# Patient Record
Sex: Female | Born: 2000 | Race: Black or African American | Hispanic: No | Marital: Single | Smoking: Never smoker
Health system: Southern US, Community
[De-identification: ages and names within clinical notes are randomized; demographics above are authoritative.]

## PROBLEM LIST (undated history)

## (undated) DIAGNOSIS — J45909 Unspecified asthma, uncomplicated: Secondary | ICD-10-CM

## (undated) DIAGNOSIS — I10 Essential (primary) hypertension: Secondary | ICD-10-CM

## (undated) DIAGNOSIS — K5900 Constipation, unspecified: Secondary | ICD-10-CM

## (undated) HISTORY — PX: NO PAST SURGERIES: SHX2092

---
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
## 2022-01-10 ENCOUNTER — Ambulatory Visit (INDEPENDENT_AMBULATORY_CARE_PROVIDER_SITE_OTHER): Payer: Medicaid Other | Admitting: Nurse Practitioner

## 2022-01-10 ENCOUNTER — Encounter (HOSPITAL_BASED_OUTPATIENT_CLINIC_OR_DEPARTMENT_OTHER): Payer: Self-pay | Admitting: Nurse Practitioner

## 2022-01-10 VITALS — BP 117/72 | HR 68 | Ht 62.0 in | Wt 311.7 lb

## 2022-01-10 DIAGNOSIS — Z6841 Body Mass Index (BMI) 40.0 and over, adult: Secondary | ICD-10-CM

## 2022-01-10 DIAGNOSIS — F411 Generalized anxiety disorder: Secondary | ICD-10-CM | POA: Diagnosis not present

## 2022-01-10 DIAGNOSIS — F332 Major depressive disorder, recurrent severe without psychotic features: Secondary | ICD-10-CM

## 2022-01-10 MED ORDER — SERTRALINE HCL 50 MG PO TABS: 50.0000 mg | ORAL_TABLET | Freq: Every day | ORAL | 3 refills | Status: DC
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
## 2022-01-10 NOTE — Patient Instructions (Signed)
Thank you for choosing [facility] at [facility] for your Primary Care needs. I am excited for the opportunity to partner with you to meet your health care goals. It was a pleasure meeting you today!  Recommendations from today's visit: I will get some labs today - I will be in touch with you about the results We can make some plans about your weight management once we have those results back I want you to restart the sertraline at 68m. I would like to check with you in about 2 weeks with a phone call to see how you are feeling and we can plan to increase if needed I want you to try to keep your carbohydrates between 150-180 grams a day I want you to eat protein with every meal Start walking 10 minutes a day  Information on diet, exercise, and health maintenance recommendations are listed below. This is information to help you be sure you are on track for optimal health and monitoring.   Please look over this and let [place] if you have any questions or if you have completed any of the health maintenance outside of [facility] that we can be sure your records are up to date.  ___________________________________________________________ About Me: I am an Adult-Geriatric Nurse Practitioner with a background in caring for patients for more than 20 years with a strong intensive care background. I provide primary care and sports medicine services to patients age 204and older within this office. My education had a strong focus on caring for the older adult population, which I am passionate about. I am also the director of the APP Fellowship with [facility]   My desire is to provide you with the best service through preventive medicine and supportive care. I consider you a part of the medical team and value your input. I work diligently to ensure that you are heard and your needs are met in a safe and effective manner. I want you to feel comfortable with me as your provider and  want you to know that your health concerns are important to me.  For your information, our office hours are: Monday, Tuesday, and Thursday 8:00 AM - 5:00 PM Wednesday and Friday 8:00 AM - 12:00 PM.   In my time away from the office I am teaching new APP's within the system and am unavailable, but my partner, Dr. dBurnard Buntingis in the office for emergent needs.   If you have questions or concerns, please call our office at [phone] send [facility] MyChart message and we will respond as quickly as possible.  ____________________________________________________________ MyChart:  For all urgent or time sensitive needs we ask that you please call the office to avoid delays. Our number is [phone]. MyChart is not constantly monitored and due to the large volume of messages a day, replies may take up to 72 business hours.  MyChart Policy: MyChart allows for you to see your visit notes, after visit summary, provider recommendations, lab and tests results, make an appointment, request refills, and contact your provider or the office for non-urgent questions or concerns. Providers are seeing patients during normal business hours and do not have built in time to review MyChart messages.  We ask that you allow a minimum of 3 business days for responses to [employer] For this reason, please do not send urgent requests through [place] Please call the office at [phone] New and ongoing conditions may require a visit. We have virtual  and in person visit available for your convenience.  Complex MyChart concerns may require a visit. Your provider may request you schedule a virtual or in person visit to ensure we are providing the best care possible. MyChart messages sent after 11:00 AM on Friday will not be received by the provider until Monday morning.    Lab and Test Results: You will receive your lab and test results on MyChart as soon as they are completed and results have been sent by the lab  or testing facility. Due to this service, you will receive your results BEFORE your provider.  I review lab and tests results each morning prior to seeing patients. Some results require collaboration with other providers to ensure you are receiving the most appropriate care. For this reason, we ask that you please allow a minimum of 3-5 business days from the time the ALL results have been received for your provider to receive and review lab and test results and contact you about these.  Most lab and test result comments from the provider will be sent through [facility]. Your provider may recommend changes to the plan of care, follow-up visits, repeat testing, ask questions, or request an office visit to discuss these results. You may reply directly to this message or call the office at [phone] to provide information for the provider or set up an appointment. In some instances, you will be called with test results and recommendations. Please let us know if this is preferred and we will make note of this in your chart to provide this for you.    If you have not heard a response to your lab or test results in 5 business days from all results returning to [place], please call the office to let us know. We ask that you please avoid calling prior to this time unless there is an emergent concern. Due to high call volumes, this can delay the resulting process.  After Hours: For all non-emergency after hours needs, please call the office at [phone] and select the option to reach the on-call provider service. On-call services are shared between multiple [facility] offices and therefore it will not be possible to speak directly with your provider. On-call providers may provide medical advice and recommendations, but are unable to provide refills for maintenance medications.  For all emergency or urgent medical needs after normal business hours, we recommend that you seek care at the closest Urgent Care or  Emergency Department to ensure appropriate treatment in a timely manner.  [facility] has a 24 hour emergency room located on the ground floor for your convenience.   Urgent Concerns During the Business Day Providers are seeing patients from 8AM to [place] with a busy schedule and are most often not able to respond to non-urgent calls until the end of the day or the next business day. If you should have URGENT concerns during the day, please call and speak to the nurse or schedule a same day appointment so that we can address your concern without delay.   Thank you, again, for choosing me as your health care partner. I appreciate your trust and look forward to learning more about you.   Worthy Keeler, DNP, AGNP-c ___________________________________________________________  Health Maintenance Recommendations Screening Testing Mammogram Every 1 -2 years based on history and risk factors Starting at age 9 Pap Smear Ages 21-39 every 3 years Ages 72-65 every 5 years with HPV testing More frequent testing may be required based on results  and history Colon Cancer Screening Every 1-10 years based on test performed, risk factors, and history Starting at age 44 Bone Density Screening Every 2-10 years based on history Starting at age 78 for women Recommendations for men differ based on medication usage, history, and risk factors AAA Screening One time ultrasound Men 72-71 years old who have every smoked Lung Cancer Screening Low Dose Lung CT every 12 months Age 23-80 years with a 30 pack-year smoking history who still smoke or who have quit within the last 15 years  Screening Labs Routine  Labs: Complete Blood Count (CBC), Complete Metabolic Panel (CMP), Cholesterol (Lipid Panel) Every 6-12 months based on history and medications May be recommended more frequently based on current conditions or previous results Hemoglobin A1c Lab Every 3-12 months based on history  and previous results Starting at age 44 or earlier with diagnosis of diabetes, high cholesterol, BMI >26, and/or risk factors Frequent monitoring for patients with diabetes to ensure blood sugar control Thyroid Panel (TSH w/ T3 & T4) Every 6 months based on history, symptoms, and risk factors May be repeated more often if on medication HIV One time testing for all patients 61 and older May be repeated more frequently for patients with increased risk factors or exposure Hepatitis C One time testing for all patients 66 and older May be repeated more frequently for patients with increased risk factors or exposure Gonorrhea, Chlamydia Every 12 months for all sexually active persons 13-24 years Additional monitoring may be recommended for those who are considered high risk or who have symptoms PSA Men 88-3 years old with risk factors Additional screening may be recommended from age 37-69 based on risk factors, symptoms, and history  Vaccine Recommendations Tetanus Booster All adults every 10 years Flu Vaccine All patients 6 months and older every year COVID Vaccine All patients 12 years and older Initial dosing with booster May recommend additional booster based on age and health history HPV Vaccine 2 doses all patients age 24-26 Dosing may be considered for patients over 26 Shingles Vaccine (Shingrix) 2 doses all adults 80 years and older Pneumonia (Pneumovax 23) All adults 45 years and older May recommend earlier dosing based on health history Pneumonia (Prevnar 95) All adults 75 years and older Dosed 1 year after Pneumovax 23  Additional Screening, Testing, and Vaccinations may be recommended on an individualized basis based on family history, health history, risk factors, and/or exposure.  __________________________________________________________  Diet Recommendations for All Patients  I recommend that all patients maintain a diet low in saturated fats, carbohydrates, and  cholesterol. While this can be challenging at first, it is not impossible and small changes can make big differences.  Things to try: Decreasing the amount of soda, sweet tea, and/or juice to one or less per day and replace with water While water is always the first choice, if you do not like water you may consider adding a water additive without sugar to improve the taste other sugar free drinks Replace potatoes with a brightly colored vegetable at dinner Use healthy oils, such as canola oil or olive oil, instead of butter or hard margarine Limit your bread intake to two pieces or less a day Replace regular pasta with low carb pasta options Bake, broil, or grill foods instead of frying Monitor portion sizes  Eat smaller, more frequent meals throughout the day instead of large meals  An important thing to remember is, if you love foods that are not great for your health, you don't  have to give them up completely. Instead, allow these foods to be a reward when you have done well. Allowing yourself to still have special treats every once in a while is a nice way to tell yourself thank you for working hard to keep yourself healthy.   Also remember that every day is a new day. If you have a bad day and "fall off the wagon", you can still climb right back up and keep moving along on your journey!  We have resources available to help you!  Some websites that may be helpful include: www.http://carter.biz/  Www.VeryWellFit.com _____________________________________________________________  Activity Recommendations for All Patients  I recommend that all adults get at least 20 minutes of moderate physical activity that elevates your heart rate at least 5 days out of the week.  Some examples include: Walking or jogging at a pace that allows you to carry on a conversation Cycling (stationary bike or outdoors) Water aerobics Yoga Weight lifting Dancing If physical limitations prevent you from putting  stress on your joints, exercise in a pool or seated in a chair are excellent options.  Do determine your MAXIMUM heart rate for activity: YOUR AGE - 220 = MAX HeartRate   Remember! Do not push yourself too hard.  Start slowly and build up your pace, speed, weight, time in exercise, etc.  Allow your body to rest between exercise and get good sleep. You will need more water than normal when you are exerting yourself. Do not wait until you are thirsty to drink. Drink with a purpose of getting in at least 8, 8 ounce glasses of water a day plus more depending on how much you exercise and sweat.    If you begin to develop dizziness, chest pain, abdominal pain, jaw pain, shortness of breath, headache, vision changes, lightheadedness, or other concerning symptoms, stop the activity and allow your body to rest. If your symptoms are severe, seek emergency evaluation immediately. If your symptoms are concerning, but not severe, please let us know so that we can recommend further evaluation.

## 2022-01-11 DIAGNOSIS — Z6841 Body Mass Index (BMI) 40.0 and over, adult: Secondary | ICD-10-CM | POA: Insufficient documentation

## 2022-01-11 LAB — CBC WITH DIFF/PLATELET
Basophils Absolute: 0 10*3/uL (ref 0.0–0.2)
Basos: 1 %
EOS (ABSOLUTE): 0.3 10*3/uL (ref 0.0–0.4)
Eos: 6 %
Hematocrit: 38.1 % (ref 34.0–46.6)
Hemoglobin: 12.9 g/dL (ref 11.1–15.9)
Immature Grans (Abs): 0 10*3/uL (ref 0.0–0.1)
Immature Granulocytes: 1 %
Lymphocytes Absolute: 1.7 10*3/uL (ref 0.7–3.1)
Lymphs: 43 %
MCH: 29.3 pg (ref 26.6–33.0)
MCHC: 33.9 g/dL (ref 31.5–35.7)
MCV: 86 fL (ref 79–97)
Monocytes Absolute: 0.5 10*3/uL (ref 0.1–0.9)
Monocytes: 12 %
Neutrophils Absolute: 1.5 10*3/uL (ref 1.4–7.0)
Neutrophils: 37 %
Platelets: 349 10*3/uL (ref 150–450)
RBC: 4.41 x10E6/uL (ref 3.77–5.28)
RDW: 12.3 % (ref 11.7–15.4)
WBC: 4 10*3/uL (ref 3.4–10.8)

## 2022-01-11 LAB — COMPREHENSIVE METABOLIC PANEL
ALT: 13 IU/L (ref 0–32)
AST: 16 IU/L (ref 0–40)
Albumin/Globulin Ratio: 1.4 (ref 1.2–2.2)
Albumin: 4.3 g/dL (ref 3.9–5.0)
Alkaline Phosphatase: 56 IU/L (ref 42–106)
BUN/Creatinine Ratio: 12 (ref 9–23)
BUN: 8 mg/dL (ref 6–20)
Bilirubin Total: 0.3 mg/dL (ref 0.0–1.2)
CO2: 24 mmol/L (ref 20–29)
Calcium: 9.6 mg/dL (ref 8.7–10.2)
Chloride: 103 mmol/L (ref 96–106)
Creatinine, Ser: 0.68 mg/dL (ref 0.57–1.00)
Globulin, Total: 3.1 g/dL (ref 1.5–4.5)
Glucose: 87 mg/dL (ref 70–99)
Potassium: 4 mmol/L (ref 3.5–5.2)
Sodium: 138 mmol/L (ref 134–144)
Total Protein: 7.4 g/dL (ref 6.0–8.5)
eGFR: 129 mL/min/{1.73_m2} (ref >59)

## 2022-01-11 LAB — THYROID PANEL WITH TSH
Free Thyroxine Index: 1.9 (ref 1.2–4.9)
T3 Uptake Ratio: 26 % (ref 24–39)
T4, Total: 7.2 ug/dL (ref 4.5–12.0)
TSH: 1.1 u[IU]/mL (ref 0.450–4.500)

## 2022-01-11 LAB — HEMOGLOBIN A1C
Est. average glucose Bld gHb Est-mCnc: 114 mg/dL
Hgb A1c MFr Bld: 5.6 % (ref 4.8–5.6)

## 2022-01-11 LAB — LIPID PANEL
Chol/HDL Ratio: 4.6 ratio — ABNORMAL HIGH (ref 0.0–4.4)
Cholesterol, Total: 187 mg/dL — ABNORMAL HIGH (ref 100–169)
HDL: 41 mg/dL (ref >39)
LDL Chol Calc (NIH): 135 mg/dL — ABNORMAL HIGH (ref 0–109)
Triglycerides: 60 mg/dL (ref 0–89)
VLDL Cholesterol Cal: 11 mg/dL (ref 5–40)

## 2022-01-11 LAB — VITAMIN D 25 HYDROXY (VIT D DEFICIENCY, FRACTURES): Vit D, 25-Hydroxy: 5.9 ng/mL — ABNORMAL LOW (ref 30.0–100.0)

## 2022-01-11 NOTE — Assessment & Plan Note (Signed)
BMI 57.01 today.  Discussed multiple options with patient that can help with weight loss assistance.  Would like to obtain baseline labs today for evaluation to ensure that there are no underlying conditions that could be contributing to her symptoms.  She is in agreement to this today. Discussion with patient on healthy dietary options to best manage weight as well as utilization of carbohydrate counting and appropriate snacking.  Handout information provided to patient. We will monitor labs and make changes to plan of care as necessary based on findings.  I do feel that this patient be an excellent candidate for GLP-1 medication given her current BMI and age.  I would like to prevent chronic illnesses by managing this nausea symptom rather than trying to fix illnesses when she is older.  We will plan to follow-up in the next couple of weeks once labs have resulted.

## 2022-01-11 NOTE — Assessment & Plan Note (Signed)
Chronic.  Previously well managed with sertraline 50 mg once a day.  We will begin this medication today and monitor closely.  No alarm symptoms present at this time.

## 2022-01-11 NOTE — Assessment & Plan Note (Signed)
Chronic.  Has been struggling with this for many years.  She has previously been on sertraline with good control.  She would like to restart this medication today if possible.  I do feel that this is appropriate start.  No SI/HI today.  Patient is oriented and making appropriate decisions.  Patient praised for her recognition that the medication is helpful and encouraged her to utilize medications as she would with any other chronic illness.  We will continue to follow closely.

## 2022-01-17 ENCOUNTER — Other Ambulatory Visit (HOSPITAL_BASED_OUTPATIENT_CLINIC_OR_DEPARTMENT_OTHER): Payer: Self-pay

## 2022-01-17 DIAGNOSIS — E559 Vitamin D deficiency, unspecified: Secondary | ICD-10-CM

## 2022-01-17 MED ORDER — VITAMIN D (ERGOCALCIFEROL) 1.25 MG (50000 UNIT) PO CAPS: 50000.0000 [IU] | ORAL_CAPSULE | ORAL | 0 refills | Status: DC

## 2022-01-24 ENCOUNTER — Encounter (HOSPITAL_BASED_OUTPATIENT_CLINIC_OR_DEPARTMENT_OTHER): Payer: Self-pay | Admitting: Nurse Practitioner

## 2022-01-24 ENCOUNTER — Ambulatory Visit (INDEPENDENT_AMBULATORY_CARE_PROVIDER_SITE_OTHER): Payer: Medicaid Other | Admitting: Nurse Practitioner

## 2022-01-24 DIAGNOSIS — Z6841 Body Mass Index (BMI) 40.0 and over, adult: Secondary | ICD-10-CM | POA: Diagnosis not present

## 2022-01-24 DIAGNOSIS — F411 Generalized anxiety disorder: Secondary | ICD-10-CM | POA: Diagnosis not present

## 2022-01-24 DIAGNOSIS — F332 Major depressive disorder, recurrent severe without psychotic features: Secondary | ICD-10-CM

## 2022-01-24 MED ORDER — SERTRALINE HCL 50 MG PO TABS: 50.0000 mg | ORAL_TABLET | Freq: Every day | ORAL | 3 refills | Status: DC

## 2022-01-24 NOTE — Progress Notes (Signed)
Virtual Visit Encounter telephone visit.   I connected with  [name] on 02/07/22 at  9:30 AM EDT by secure audio telemedicine application. I verified that I am speaking with the correct person using two identifiers.   I introduced myself as a Publishing rights manager with the practice. The limitations of evaluation and management by telemedicine discussed with the patient and the availability of in person appointments. The patient expressed verbal understanding and consent to proceed.  Participating parties in this visit include: Myself and patient  The patient is: Patient Location: Home I am: Provider Location: Office/Clinic Subjective:    CC and HPI: Amarilys Lyles is a 21 y.o. year old female presenting for follow up of Mood and Weight. Patient reports the following: Anxiety and Depression -recently restarted sertraline 50mg , which was helpful for her mood symptoms in the past - She feels a lot better and tells me that she feels like her dosing is at a good place at this time - She is not having any side effects of the medication -She is taking her medication as prescribed without missed doses -She denies SI/HI  Weight management - She has been walking daily - She has been eating dinner earlier in the evening and working on her portion sizes - She is having some hunger in between meals, but is working hard to avoid unhealthy snacking.  Past medical history, Surgical history, Family history not pertinant except as noted below, Social history, Allergies, and medications have been entered into the medical record, reviewed, and corrections made.   Review of Systems:  All review of systems negative except what is listed in the HPI  Objective:    Alert and oriented x 4 Speaking in clear sentences with no shortness of breath. No distress.  Impression and Recommendations:    Problem List Items Addressed This Visit     Generalized anxiety disorder    Chronic.  She has recently  restarted sertraline and is doing very well on 50 mg.  I am pleased to hear that she is not having any side effects and is tolerating the medication well.  At this time recommend continuation of medication and continue monitoring for any new or worsening symptoms.  Patient encouraged to contact the office immediately if she begins to notice any changes in her mood in a negative way.  We will send refills for the sertraline today.      Relevant Medications   sertraline (ZOLOFT) 50 MG tablet   Severe episode of recurrent major depressive disorder, without psychotic features (HCC) - Primary    Chronic.  Significant improvement with start of sertraline 50 mg daily.  Patient is tolerating medication well and taking medication as prescribed.  No alarm symptoms are present at this time.  Given positive response we will send refills for medication today.  Patient encouraged to continue to monitor her mood and sleep patterns and notify immediately if new or worsening symptoms present.      Relevant Medications   sertraline (ZOLOFT) 50 MG tablet   BMI 50.0-59.9, adult (HCC)    Elevated BMI with increased risk of significant impact on cardiovascular health.  Unfortunately the patient's insurance would not cover GLP-1 weight loss medication.  I do feel that this would be greatly beneficial for the patient and her overall health status.  She is making great efforts to improve her overall health by monitoring her diet closely and exercising regularly.  I am so proud of her for recognizing what needs to  be done and making the changes needed for her health.  At this time recommend continuation with current measures.  I do feel that she is motivated to have successful weight loss on her own.  We will continue to monitor and review to see if we are able to get medications that may be helpful for her to reach her goals.      Relevant Medications   sertraline (ZOLOFT) 50 MG tablet    current treatment plan is  effective, no change in therapy I discussed the assessment and treatment plan with the patient. The patient was provided an opportunity to ask questions and all were answered. The patient agreed with the plan and demonstrated an understanding of the instructions.   The patient was advised to call back or seek an in-person evaluation if the symptoms worsen or if the condition fails to improve as anticipated.  Follow-Up: in 3 months  I provided 33 minutes of non-face-to-face interaction with this non face-to-face encounter including intake, same-day documentation, and chart review.   Tollie Eth, NP , DNP, AGNP-c [facility] Primary Care & Sports Medicine at [facility] [phone] (fax)

## 2022-01-24 NOTE — Patient Instructions (Addendum)
Keep up the great work!! I am so proud of you.

## 2022-02-07 NOTE — Assessment & Plan Note (Signed)
Chronic.  Significant improvement with start of sertraline 50 mg daily.  Patient is tolerating medication well and taking medication as prescribed.  No alarm symptoms are present at this time.  Given positive response we will send refills for medication today.  Patient encouraged to continue to monitor her mood and sleep patterns and notify immediately if new or worsening symptoms present.

## 2022-02-07 NOTE — Assessment & Plan Note (Signed)
Elevated BMI with increased risk of significant impact on cardiovascular health.  Unfortunately the patient's insurance would not cover GLP-1 weight loss medication.  I do feel that this would be greatly beneficial for the patient and her overall health status.  She is making great efforts to improve her overall health by monitoring her diet closely and exercising regularly.  I am so proud of her for recognizing what needs to be done and making the changes needed for her health.  At this time recommend continuation with current measures.  I do feel that she is motivated to have successful weight loss on her own.  We will continue to monitor and review to see if we are able to get medications that may be helpful for her to reach her goals.

## 2022-02-07 NOTE — Assessment & Plan Note (Signed)
Chronic.  She has recently restarted sertraline and is doing very well on 50 mg.  I am pleased to hear that she is not having any side effects and is tolerating the medication well.  At this time recommend continuation of medication and continue monitoring for any new or worsening symptoms.  Patient encouraged to contact the office immediately if she begins to notice any changes in her mood in a negative way.  We will send refills for the sertraline today.

## 2022-04-10 ENCOUNTER — Encounter (HOSPITAL_BASED_OUTPATIENT_CLINIC_OR_DEPARTMENT_OTHER): Payer: Self-pay | Admitting: Nurse Practitioner

## 2022-04-26 ENCOUNTER — Ambulatory Visit (HOSPITAL_BASED_OUTPATIENT_CLINIC_OR_DEPARTMENT_OTHER): Payer: Medicaid Other | Admitting: Nurse Practitioner

## 2022-04-26 ENCOUNTER — Other Ambulatory Visit (HOSPITAL_BASED_OUTPATIENT_CLINIC_OR_DEPARTMENT_OTHER): Payer: Self-pay

## 2022-04-26 ENCOUNTER — Encounter (HOSPITAL_BASED_OUTPATIENT_CLINIC_OR_DEPARTMENT_OTHER): Payer: Self-pay | Admitting: Nurse Practitioner

## 2022-04-26 ENCOUNTER — Ambulatory Visit (INDEPENDENT_AMBULATORY_CARE_PROVIDER_SITE_OTHER): Payer: Medicaid Other | Admitting: Nurse Practitioner

## 2022-04-26 VITALS — BP 138/79 | HR 76 | Resp 99 | Ht 62.0 in | Wt 303.0 lb

## 2022-04-26 DIAGNOSIS — Z23 Encounter for immunization: Secondary | ICD-10-CM | POA: Diagnosis not present

## 2022-04-26 DIAGNOSIS — F411 Generalized anxiety disorder: Secondary | ICD-10-CM

## 2022-04-26 DIAGNOSIS — L308 Other specified dermatitis: Secondary | ICD-10-CM

## 2022-04-26 DIAGNOSIS — J4540 Moderate persistent asthma, uncomplicated: Secondary | ICD-10-CM

## 2022-04-26 DIAGNOSIS — F332 Major depressive disorder, recurrent severe without psychotic features: Secondary | ICD-10-CM | POA: Diagnosis not present

## 2022-04-26 DIAGNOSIS — Z6841 Body Mass Index (BMI) 40.0 and over, adult: Secondary | ICD-10-CM | POA: Diagnosis not present

## 2022-04-26 MED ORDER — BUDESONIDE-FORMOTEROL FUMARATE 160-4.5 MCG/ACT IN AERO
2.0000 | INHALATION_SPRAY | Freq: Two times a day (BID) | RESPIRATORY_TRACT | 1 refills | Status: DC
  Filled 2022-04-26 – 2022-06-26 (×4): qty 30.6, 90d supply, fill #0

## 2022-04-26 MED ORDER — IPRATROPIUM BROMIDE 0.06 % NA SOLN
2.0000 | Freq: Three times a day (TID) | NASAL | 11 refills | Status: DC
  Filled 2022-04-26 – 2022-05-21 (×2): qty 15, 25d supply, fill #0

## 2022-04-26 MED ORDER — ALBUTEROL SULFATE HFA 108 (90 BASE) MCG/ACT IN AERS
2.0000 | INHALATION_SPRAY | Freq: Four times a day (QID) | RESPIRATORY_TRACT | 5 refills | Status: DC | PRN
Start: 2022-04-26 — End: 2022-07-30
  Filled 2022-04-26: qty 18, 28d supply, fill #0
  Filled 2022-05-21: qty 18, 25d supply, fill #0

## 2022-04-26 MED ORDER — MONTELUKAST SODIUM 10 MG PO TABS
10.0000 mg | ORAL_TABLET | Freq: Every day | ORAL | 3 refills | Status: DC
  Filled 2022-04-26 – 2022-06-26 (×4): qty 90, 90d supply, fill #0

## 2022-04-26 MED ORDER — MOMETASONE FUROATE 0.1 % EX CREA
TOPICAL_CREAM | CUTANEOUS | 3 refills | Status: AC
  Filled 2022-04-26: qty 45, 30d supply, fill #0
  Filled 2022-05-21: qty 15, 30d supply, fill #0

## 2022-04-26 MED ORDER — SERTRALINE HCL 50 MG PO TABS
50.0000 mg | ORAL_TABLET | Freq: Every day | ORAL | 3 refills | Status: DC
  Filled 2022-04-26: qty 90, 90d supply, fill #0

## 2022-04-26 NOTE — Progress Notes (Signed)
[name], DNP, AGNP-c [facility] [address] [place], [place] [address] [phone] Office [phone] Fax  ESTABLISHED PATIENT- Chronic Health and/or Follow-Up Visit  Blood pressure 138/79, pulse 76, resp. rate (!) 99, height 5\' 2"  (1.575 m), weight (!) 303 lb (137.4 kg).    Sherry Dawson is a 21 y.o. year old female presenting today for evaluation and management of the following: Follow-up (Patient presents for follow up mood, patient states medication is working well. )  Mood She tells me her mood is feeling much better.  She is tolerating the medication well and is not having any side effects. She tells me she is where she would like to be as far as her mood goes.    Asthma She tells me that she needs refills on her asthma medication today.  She is not having any current exacerbations but would like to continue the current medications.  All ROS negative with exception of what is listed above.   PHYSICAL EXAM Physical Exam Vitals and nursing note reviewed.  Constitutional:      General: She is not in acute distress.    Appearance: Normal appearance.  HENT:     Head: Normocephalic.  Eyes:     Extraocular Movements: Extraocular movements intact.     Conjunctiva/sclera: Conjunctivae normal.     Pupils: Pupils are equal, round, and reactive to light.  Neck:     Vascular: No carotid bruit.  Cardiovascular:     Rate and Rhythm: Normal rate and regular rhythm.     Pulses: Normal pulses.     Heart sounds: Normal heart sounds. No murmur heard. Pulmonary:     Effort: Pulmonary effort is normal.     Breath sounds: Normal breath sounds. No wheezing.  Abdominal:     General: Bowel sounds are normal. There is no distension.     Palpations: Abdomen is soft.     Tenderness: There is no abdominal tenderness. There is no guarding.  Musculoskeletal:        General: Normal range of motion.     Cervical back: Normal range of  motion and neck supple.     Right lower leg: No edema.     Left lower leg: No edema.  Lymphadenopathy:     Cervical: No cervical adenopathy.  Skin:    General: Skin is warm and dry.     Capillary Refill: Capillary refill takes less than 2 seconds.  Neurological:     General: No focal deficit present.     Mental Status: She is alert and oriented to person, place, and time.  Psychiatric:        Mood and Affect: Mood normal.        Behavior: Behavior normal.        Thought Content: Thought content normal.        Judgment: Judgment normal.     PLAN Problem List Items Addressed This Visit     Eczema    Medication refills provided today.  No alarm symptoms      Relevant Medications   mometasone (ELOCON) 0.1 % cream   Asthma - Primary    Chronic asthma with daily medication adherence.  No alarm symptoms are present at this time.  We will send refills today.      Relevant Medications   albuterol (VENTOLIN HFA) 108 (90 Base) MCG/ACT inhaler   budesonide-formoterol (SYMBICORT) 160-4.5 MCG/ACT inhaler   ipratropium (ATROVENT) 0.06 % nasal spray  montelukast (SINGULAIR) 10 MG tablet   Generalized anxiety disorder    Significant improvement with sertraline.  Patient is tolerating medication well with no side effects.  We will plan to continue current dose of medication.  Plan to follow-up in 3 months or sooner if needed.      Relevant Medications   sertraline (ZOLOFT) 50 MG tablet   Severe episode of recurrent major depressive disorder, without psychotic features (HCC)    Mood well controlled on sertraline.  No changes to medication at this time.  Continue current dosing.  Refills provided today.  Patient will follow-up if symptoms worsen or fail to improve.      Relevant Medications   sertraline (ZOLOFT) 50 MG tablet   BMI 50.0-59.9, adult (HCC)   Relevant Medications   sertraline (ZOLOFT) 50 MG tablet   Other Visit Diagnoses     Influenza vaccine needed       Relevant  Orders   Flu Vaccine QUAD 6+ mos PF IM (Fluarix Quad PF) (Completed)       Return in about 3 months (around 07/26/2022) for weight.   [name], DNP, AGNP-c 04/26/2022  9:05 AM

## 2022-04-26 NOTE — Patient Instructions (Addendum)
Walk 10 minutes every day  Portion out food and snacks  Watch your snacks

## 2022-05-09 ENCOUNTER — Other Ambulatory Visit (HOSPITAL_BASED_OUTPATIENT_CLINIC_OR_DEPARTMENT_OTHER): Payer: Self-pay

## 2022-05-11 NOTE — Assessment & Plan Note (Signed)
Significant improvement with sertraline.  Patient is tolerating medication well with no side effects.  We will plan to continue current dose of medication.  Plan to follow-up in 3 months or sooner if needed.

## 2022-05-11 NOTE — Assessment & Plan Note (Signed)
Medication refills provided today.  No alarm symptoms 

## 2022-05-11 NOTE — Assessment & Plan Note (Signed)
Mood well controlled on sertraline.  No changes to medication at this time.  Continue current dosing.  Refills provided today.  Patient will follow-up if symptoms worsen or fail to improve.

## 2022-05-11 NOTE — Assessment & Plan Note (Signed)
Chronic asthma with daily medication adherence.  No alarm symptoms are present at this time.  We will send refills today.

## 2022-05-20 ENCOUNTER — Ambulatory Visit: Admit: 2022-05-20 | Discharge status: Absent without leave | Payer: Medicaid Other

## 2022-05-21 ENCOUNTER — Other Ambulatory Visit (HOSPITAL_COMMUNITY): Payer: Self-pay

## 2022-05-21 ENCOUNTER — Encounter (HOSPITAL_BASED_OUTPATIENT_CLINIC_OR_DEPARTMENT_OTHER): Payer: Self-pay | Admitting: Nurse Practitioner

## 2022-05-21 ENCOUNTER — Ambulatory Visit: Payer: Self-pay

## 2022-05-21 ENCOUNTER — Ambulatory Visit (INDEPENDENT_AMBULATORY_CARE_PROVIDER_SITE_OTHER): Payer: Medicaid Other | Admitting: Nurse Practitioner

## 2022-05-21 ENCOUNTER — Other Ambulatory Visit (HOSPITAL_BASED_OUTPATIENT_CLINIC_OR_DEPARTMENT_OTHER): Payer: Self-pay

## 2022-05-21 DIAGNOSIS — U071 COVID-19: Secondary | ICD-10-CM | POA: Diagnosis not present

## 2022-05-21 HISTORY — DX: COVID-19: U07.1

## 2022-05-21 MED ORDER — HYDROCODONE BIT-HOMATROP MBR 5-1.5 MG/5ML PO SOLN
5.0000 mL | Freq: Three times a day (TID) | ORAL | 0 refills | Status: DC | PRN
  Filled 2022-05-21: qty 120, 8d supply, fill #0

## 2022-05-21 MED ORDER — GUAIFENESIN ER 600 MG PO TB12
ORAL_TABLET | ORAL | 2 refills | Status: DC
  Filled 2022-05-21: qty 30, fill #0

## 2022-05-21 MED ORDER — IBUPROFEN 600 MG PO TABS
600.0000 mg | ORAL_TABLET | Freq: Three times a day (TID) | ORAL | 0 refills | Status: DC | PRN
  Filled 2022-05-21: qty 30, 10d supply, fill #0

## 2022-05-21 MED ORDER — ALBUTEROL SULFATE HFA 108 (90 BASE) MCG/ACT IN AERS
2.0000 | INHALATION_SPRAY | Freq: Four times a day (QID) | RESPIRATORY_TRACT | 11 refills | Status: DC | PRN
  Filled 2022-05-21: qty 13.4, fill #0

## 2022-05-21 NOTE — Progress Notes (Signed)
Virtual Visit Encounter telephone visit.   I connected with  Sherry Dawson on 05/21/22 at 10:50 AM EDT by secure audio telemedicine application. I verified that I am speaking with the correct person using two identifiers.   I introduced myself as a Designer, jewellery with the practice. The limitations of evaluation and management by telemedicine discussed with the patient and the availability of in person appointments. The patient expressed verbal understanding and consent to proceed.  Participating parties in this visit include: Myself and patient  The patient is: Patient Location: Home I am: Provider Location: Office/Clinic Subjective:    CC and HPI: Sherry Dawson is a 21 y.o. year old female presenting for new evaluation and treatment of COVID-19. Patient reports the following: COVID Onset of symptoms yesterday with sore throat, chills, body aches, congestion, headache, increased mucus, and wheezing.  Patient's mother tested positive for COVID over the weekend.  She tested positive for COVID within the last 24 hours.  This time she has been using Tylenol and over-the-counter cough medication with little response.  She would like a school note.  She denies severe shortness of breath, dizziness, chest pain.  Past medical history, Surgical history, Family history not pertinant except as noted below, Social history, Allergies, and medications have been entered into the medical record, reviewed, and corrections made.   Review of Systems:  All review of systems negative except what is listed in the HPI  Objective:    Alert and oriented x 4 Speaking in clear sentences with no shortness of breath. No distress.  Impression and Recommendations:    Problem List Items Addressed This Visit     COVID-19 - Primary    Positive COVID-19 viral infection with no alarm symptoms present at this time.  Recommend alternating ibuprofen 600 mg and Tylenol 1000 mg every 4-6 hours as needed for fevers,  aches, pains, headache.  We will send in Hycodan cough syrup to help with severe cough and congested symptoms.  Albuterol inhaler provided for wheezing.  Patient encouraged to utilize Mucinex to help with increased mucus production.  Increased hydration and rest are highly recommended.  School note provided for this week.  We will plan to call patient and check in on Friday with follow-up visit.       Relevant Medications   HYDROcodone bit-homatropine (HYCODAN) 5-1.5 MG/5ML syrup   ibuprofen (ADVIL) 600 MG tablet   albuterol (VENTOLIN HFA) 108 (90 Base) MCG/ACT inhaler   guaiFENesin (MUCINEX) 600 MG 12 hr tablet    orders and follow up as documented in EMR I discussed the assessment and treatment plan with the patient. The patient was provided an opportunity to ask questions and all were answered. The patient agreed with the plan and demonstrated an understanding of the instructions.   The patient was advised to call back or seek an in-person evaluation if the symptoms worsen or if the condition fails to improve as anticipated.  Follow-Up: in a few days  I provided 22 minutes of non-face-to-face interaction with this non face-to-face encounter including intake, same-day documentation, and chart review.   Orma Render, NP , DNP, AGNP-c [place] [facility] [phone] (fax)

## 2022-05-21 NOTE — Assessment & Plan Note (Signed)
Positive COVID-19 viral infection with no alarm symptoms present at this time.  Recommend alternating ibuprofen 600 mg and Tylenol 1000 mg every 4-6 hours as needed for fevers, aches, pains, headache.  We will send in Hycodan cough syrup to help with severe cough and congested symptoms.  Albuterol inhaler provided for wheezing.  Patient encouraged to utilize Mucinex to help with increased mucus production.  Increased hydration and rest are highly recommended.  School note provided for this week.  We will plan to call patient and check in on Friday with follow-up visit.

## 2022-05-23 ENCOUNTER — Encounter (HOSPITAL_BASED_OUTPATIENT_CLINIC_OR_DEPARTMENT_OTHER): Payer: Self-pay

## 2022-05-23 ENCOUNTER — Encounter (HOSPITAL_BASED_OUTPATIENT_CLINIC_OR_DEPARTMENT_OTHER): Payer: Self-pay | Admitting: Nurse Practitioner

## 2022-05-27 ENCOUNTER — Ambulatory Visit
Admission: RE | Admit: 2022-05-27 | Discharge: 2022-05-27 | Disposition: A | Discharge status: Home / Self Care | Payer: Medicaid Other | Source: Ambulatory Visit | Attending: Nurse Practitioner | Admitting: Nurse Practitioner

## 2022-05-27 VITALS — BP 135/86 | HR 91 | Temp 97.7°F | Resp 18

## 2022-05-27 DIAGNOSIS — J4541 Moderate persistent asthma with (acute) exacerbation: Secondary | ICD-10-CM | POA: Diagnosis not present

## 2022-05-27 DIAGNOSIS — J22 Unspecified acute lower respiratory infection: Secondary | ICD-10-CM

## 2022-05-27 DIAGNOSIS — Z8616 Personal history of COVID-19: Secondary | ICD-10-CM

## 2022-05-27 MED ORDER — PREDNISONE 20 MG PO TABS: 40.0000 mg | ORAL_TABLET | Freq: Every day | ORAL | 0 refills | Status: AC

## 2022-05-27 MED ORDER — AMOXICILLIN-POT CLAVULANATE 875-125 MG PO TABS: 1.0000 | ORAL_TABLET | Freq: Two times a day (BID) | ORAL | 0 refills | Status: AC

## 2022-05-27 MED ORDER — BENZONATATE 100 MG PO CAPS: 100.0000 mg | ORAL_CAPSULE | Freq: Three times a day (TID) | ORAL | 0 refills | Status: DC | PRN

## 2022-05-27 NOTE — ED Triage Notes (Signed)
Pt reports a cough, runny nose, and fatigue after a covid exposure around 10/7. States symptoms have not been getting better.
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
## 2022-05-27 NOTE — Discharge Instructions (Addendum)
We have seen you today for an exacerbation of your asthma.  Please continue your asthma medications as previously prescribed.  In addition, start the prednisone and take it daily for 5 days to treat the inflammation in your lungs.  Also start on Augmentin to cover for any infection in your lungs.  Continue Mucinex 600 mg twice daily, steam showers, nasal saline rinses, and you can use the Tessalon Perles at nighttime as needed for cough.  Try to only take this sparingly for dry cough.

## 2022-05-29 ENCOUNTER — Other Ambulatory Visit (HOSPITAL_BASED_OUTPATIENT_CLINIC_OR_DEPARTMENT_OTHER): Payer: Self-pay

## 2022-06-26 ENCOUNTER — Other Ambulatory Visit (HOSPITAL_BASED_OUTPATIENT_CLINIC_OR_DEPARTMENT_OTHER): Payer: Self-pay

## 2022-07-12 ENCOUNTER — Other Ambulatory Visit (HOSPITAL_BASED_OUTPATIENT_CLINIC_OR_DEPARTMENT_OTHER): Payer: Self-pay

## 2022-07-26 ENCOUNTER — Ambulatory Visit: Payer: Medicaid Other | Admitting: Nurse Practitioner

## 2022-07-26 ENCOUNTER — Ambulatory Visit (HOSPITAL_BASED_OUTPATIENT_CLINIC_OR_DEPARTMENT_OTHER): Payer: Medicaid Other | Admitting: Nurse Practitioner

## 2022-07-30 ENCOUNTER — Ambulatory Visit: Payer: Medicaid Other | Admitting: Nurse Practitioner

## 2022-07-30 ENCOUNTER — Ambulatory Visit (INDEPENDENT_AMBULATORY_CARE_PROVIDER_SITE_OTHER): Payer: Medicaid Other | Admitting: Nurse Practitioner

## 2022-07-30 ENCOUNTER — Other Ambulatory Visit (HOSPITAL_COMMUNITY): Payer: Self-pay

## 2022-07-30 ENCOUNTER — Encounter: Payer: Self-pay | Admitting: Nurse Practitioner

## 2022-07-30 VITALS — BP 124/80 | HR 96 | Temp 98.2°F | Wt 314.6 lb

## 2022-07-30 DIAGNOSIS — Z6841 Body Mass Index (BMI) 40.0 and over, adult: Secondary | ICD-10-CM | POA: Diagnosis not present

## 2022-07-30 DIAGNOSIS — J4540 Moderate persistent asthma, uncomplicated: Secondary | ICD-10-CM

## 2022-07-30 MED ORDER — MONTELUKAST SODIUM 10 MG PO TABS
10.0000 mg | ORAL_TABLET | Freq: Every day | ORAL | 3 refills | Status: AC
  Filled 2022-07-30: qty 90, 90d supply, fill #0

## 2022-07-30 MED ORDER — PHENTERMINE HCL 37.5 MG PO TABS
37.5000 mg | ORAL_TABLET | Freq: Every day | ORAL | 2 refills | Status: DC
  Filled 2022-07-30: qty 30, 30d supply, fill #0

## 2022-07-30 MED ORDER — ALBUTEROL SULFATE HFA 108 (90 BASE) MCG/ACT IN AERS
2.0000 | INHALATION_SPRAY | Freq: Four times a day (QID) | RESPIRATORY_TRACT | 5 refills | Status: DC | PRN
Start: 2022-07-30 — End: 2023-03-21
  Filled 2022-07-30: qty 18, 25d supply, fill #0

## 2022-07-30 MED ORDER — BUDESONIDE-FORMOTEROL FUMARATE 160-4.5 MCG/ACT IN AERO
2.0000 | INHALATION_SPRAY | Freq: Two times a day (BID) | RESPIRATORY_TRACT | 1 refills | Status: DC
  Filled 2022-07-30: qty 30.6, 90d supply, fill #0

## 2022-07-30 MED ORDER — IPRATROPIUM BROMIDE 0.06 % NA SOLN
2.0000 | Freq: Three times a day (TID) | NASAL | 11 refills | Status: DC
  Filled 2022-07-30: qty 15, 13d supply, fill #0

## 2022-07-30 NOTE — Patient Instructions (Signed)
WEIGHT LOSS PLANNING Your progress today shows:     07/30/2022   10:29 AM 05/27/2022    2:51 PM 04/26/2022    9:03 AM  Vitals with BMI  Height   5\' 2"   Weight 314 lbs 10 oz  303 lbs  BMI   55.41  Systolic 124 135  Diastolic 80 86 79  Pulse 96 91 76    For best management of weight, it is vital to balance intake versus output. This means the number of calories burned per day must be less than the calories you take in with food and drink.   I recommend trying to follow a diet with the following: Calories: 1200-1500 calories per day Carbohydrates: 150-180 grams of carbohydrates per day  Why: Gives your body enough "quick fuel" for cells to maintain normal function without sending them into starvation mode.  Protein: At least 45-55 grams of protein per day Why: Protein takes longer and uses more energy than carbohydrates to break down for fuel. The carbohydrates in your meals serves as quick energy sources and proteins help use some of that extra quick energy to break down to produce long term energy. This helps you not feel hungry as quickly and protein breakdown burns calories.  Water: Drink AT LEAST 64 ounces of water per day  Why: Water is essential to healthy metabolism. Water helps to fill the stomach and keep you fuller longer. Water is required for healthy digestion and filtering of waste in the body.  Fat: Limit fats in your diet- when choosing fats, choose foods with lower fats content such as lean meats (chicken, fish, 660).  Why: Increased fat intake leads to storage "for later". Once you burn your carbohydrate energy, your body goes into fat and protein breakdown mode to help you loose weight.  Cholesterol: Fats and oils that are LIQUID at room temperature are best. Choose vegetable oils (olive oil, avocado oil, nuts). Avoid fats that are SOLID at room temperature (animal fats, processed meats). Healthy fats are often found in whole grains, beans, nuts, seeds, and berries.   Why: Elevated cholesterol levels lead to build up of cholesterol on the inside of your blood vessels. This will eventually cause the blood vessels to become hard and can lead to high blood pressure and damage to your organs. When the blood flow is reduced, but the pressure is high from cholesterol buildup, parts of the cholesterol can break off and form clots that can go to the brain or heart leading to a stroke or heart attack.  Fiber: Increase amount of SOLUBLE the fiber in your diet. This helps to fill you up, lowers cholesterol, and helps with digestion. Some foods high in soluble fiber are oats, peas, beans, apples, carrots, barley, and citrus fruits.   Why: Fiber fills you up, helps remove excess cholesterol, and aids in healthy digestion which are all very important in weight management.   I recommend the following as a minimum activity routine: Purposeful walk or other physical activity at least 20 minutes every single day. This means purposefully taking a walk, jog, bike, swim, treadmill, elliptical, dance, etc.  This activity should be ABOVE your normal daily activities, such as walking at work. Goal exercise should be at least 150 minutes a week- work your way up to this.   Heart Rate: Your maximum exercise heart rate should be 220 - Your Age in Years. When exercising, get your heart rate up, but avoid going over the maximum targeted heart  rate.  60-70% of your maximum heart rate is where you tend to burn the most fat. To find this number:  220 - Age In Years= Max HR  Max HR x 0.6 (or 0.7) = Fat Burning HR The Fat Burning HR is your goal heart rate while working out to burn the most fat.  NEVER exercise to the point your feel lightheaded, weak, nauseated, dizzy. If you experience ANY of these symptoms- STOP exercise! Allow yourself to cool down and your heart rate to come down. Then restart slower next time.  If at ANY TIME you feel chest pain or chest pressure during exercise, STOP  IMMEDIATELY and seek medical attention.

## 2022-07-30 NOTE — Progress Notes (Signed)
  [name], DNP, AGNP-c [facility]  [address] [place], [place] [address] [phone]  ESTABLISHED PATIENT- Chronic Health and/or Follow-Up Visit  Blood pressure 124/80, pulse 96, temperature 98.2 F (36.8 C), weight (!) 314 lb 9.6 oz (142.7 kg), last menstrual period 07/13/2022.    Sherry Dawson is a 21 y.o. year old female presenting today for evaluation and management of the following: Weight Sherry Dawson endorses concerns with weight gain and difficulty with determining best steps for management. She tells me that she feels bad about her current size and is eager to lose weight. She is not currently exercising and does have a difficult time with snacking and portion control. She has not been on medication in the past for weight loss. She would like to know next best steps for management.   All ROS negative with exception of what is listed above.   PHYSICAL EXAM Physical Exam Vitals and nursing note reviewed.  Constitutional:      Appearance: Normal appearance. She is obese.  HENT:     Head: Normocephalic.  Cardiovascular:     Rate and Rhythm: Normal rate and regular rhythm.     Pulses: Normal pulses.     Heart sounds: Normal heart sounds.  Pulmonary:     Effort: Pulmonary effort is normal.     Breath sounds: Normal breath sounds.  Abdominal:     Palpations: Abdomen is soft.  Musculoskeletal:        General: Normal range of motion.  Lymphadenopathy:     Cervical: No cervical adenopathy.  Skin:    General: Skin is warm and dry.     Capillary Refill: Capillary refill takes less than 2 seconds.  Neurological:     General: No focal deficit present.     Mental Status: She is alert and oriented to person, place, and time.  Psychiatric:        Mood and Affect: Mood normal.     PLAN Problem List Items Addressed This Visit     Asthma    Chronic. Well controlled. Refills provided today.       Relevant Medications   ipratropium (ATROVENT) 0.06 %  nasal spray   albuterol (VENTOLIN HFA) 108 (90 Base) MCG/ACT inhaler   budesonide-formoterol (SYMBICORT) 160-4.5 MCG/ACT inhaler   montelukast (SINGULAIR) 10 MG tablet   BMI 50.0-59.9, adult (HCC) - Primary    Discussion with patient on ways to help facilitate weight loss and promote overall healthy lifestyle. We discussed diet and exercise recommendations and these were also provided on handout. Encouraged patient to monitor portion sizes and read labels for appropriate amount serving sizes. Discussed keeping a record of carbohydrates, protein, and fiber intake daily to help stay on track. Recommend she and her mother work together to start exercising daily. Start with 10 minute brisk walk and increase slowly with time. We will plan to start phentermine to help with appetite suppression, but she is aware this can not be used longer than 6 months, therefore lifestyle changes are critical to keeping the weight off. I do feel she would be an excellent candidate for GLP-1 if we can get approval given her current BMI. We will follow-up in 3 months to monitor progress.       Relevant Medications   phentermine (ADIPEX-P) 37.5 MG tablet    Return in about 3 months (around 10/29/2022) for Weight- Virtual OK.   [name], DNP, AGNP-c 07/30/2022 10:32 AM

## 2022-07-30 NOTE — Assessment & Plan Note (Signed)
Discussion with patient on ways to help facilitate weight loss and promote overall healthy lifestyle. We discussed diet and exercise recommendations and these were also provided on handout. Encouraged patient to monitor portion sizes and read labels for appropriate amount serving sizes. Discussed keeping a record of carbohydrates, protein, and fiber intake daily to help stay on track. Recommend she and her mother work together to start exercising daily. Start with 10 minute brisk walk and increase slowly with time. We will plan to start phentermine to help with appetite suppression, but she is aware this can not be used longer than 6 months, therefore lifestyle changes are critical to keeping the weight off. I do feel she would be an excellent candidate for GLP-1 if we can get approval given her current BMI. We will follow-up in 3 months to monitor progress.

## 2022-07-30 NOTE — Assessment & Plan Note (Signed)
Chronic. Well controlled. Refills provided today.

## 2022-09-13 ENCOUNTER — Encounter: Payer: Self-pay | Admitting: Nurse Practitioner

## 2022-10-29 ENCOUNTER — Ambulatory Visit: Payer: Medicaid Other | Admitting: Nurse Practitioner

## 2022-11-06 ENCOUNTER — Encounter (INDEPENDENT_AMBULATORY_CARE_PROVIDER_SITE_OTHER): Payer: Medicaid Other | Admitting: Family Medicine

## 2022-11-14 ENCOUNTER — Ambulatory Visit (INDEPENDENT_AMBULATORY_CARE_PROVIDER_SITE_OTHER): Payer: Medicaid Other | Admitting: Nurse Practitioner

## 2022-11-14 ENCOUNTER — Encounter: Payer: Self-pay | Admitting: Nurse Practitioner

## 2022-11-14 VITALS — BP 126/88 | HR 72 | Wt 324.8 lb

## 2022-11-14 DIAGNOSIS — R030 Elevated blood-pressure reading, without diagnosis of hypertension: Secondary | ICD-10-CM

## 2022-11-14 DIAGNOSIS — Z6841 Body Mass Index (BMI) 40.0 and over, adult: Secondary | ICD-10-CM

## 2022-11-14 NOTE — Progress Notes (Signed)
[name], DNP, AGNP-c [facility]  [address] [place], [place] [address] [phone]  ESTABLISHED PATIENT- Chronic Health and/or Follow-Up Visit  Blood pressure 126/88, pulse 72, weight (!) 324 lb 12.8 oz (147.3 kg), last menstrual period 11/14/2022.    Sherry Dawson is a 22 y.o. year old female presenting today for evaluation and management of chronic conditions.   Sherry Dawson presents today expressing concern for weight management. She reports her typical day involves waking up around 7:30 AM for school but often skipping breakfast. She usually eats around 1:15 PM, her last class time, consuming meat and microwaveable items like sandwiches. Dinner is typically between 4-5 PM, after which she often has ice cream or milkshakes, specifically from [facility], about three to five times a week.  When suggested incorporating a 20-minute walk into her daily routine as a healthy habit, Sherry Dawson acknowledges the possibility, but admits that this will be difficult to gain motivation. She also shows interest in the idea of using YouTube for dance exercises as a form of fun physical activity.  Sherry Dawson has tried weight loss medications in the past but has not been on any long enough to have substantial weight changes.   All ROS negative with exception of what is listed above.   PHYSICAL EXAM Physical Exam Vitals and nursing note reviewed.  Constitutional:      General: She is not in acute distress.    Appearance: Normal appearance. She is obese. She is not ill-appearing.  HENT:     Head: Normocephalic.  Eyes:     Extraocular Movements: Extraocular movements intact.     Conjunctiva/sclera: Conjunctivae normal.     Pupils: Pupils are equal, round, and reactive to light.  Neck:     Vascular: No carotid bruit.  Cardiovascular:     Rate and Rhythm: Normal rate and regular rhythm.     Pulses: Normal pulses.     Heart sounds: Normal heart sounds. No murmur heard. Pulmonary:      Effort: Pulmonary effort is normal.     Breath sounds: Normal breath sounds. No wheezing.  Abdominal:     General: Bowel sounds are normal. There is no distension.     Palpations: Abdomen is soft.     Tenderness: There is no abdominal tenderness. There is no guarding.  Musculoskeletal:        General: Normal range of motion.     Cervical back: Normal range of motion and neck supple.     Right lower leg: No edema.     Left lower leg: No edema.  Lymphadenopathy:     Cervical: No cervical adenopathy.  Skin:    General: Skin is warm and dry.     Capillary Refill: Capillary refill takes less than 2 seconds.  Neurological:     General: No focal deficit present.     Mental Status: She is alert and oriented to person, place, and time.  Psychiatric:        Mood and Affect: Mood normal.        Behavior: Behavior normal.     PLAN Problem List Items Addressed This Visit     BMI 50.0-59.9, adult - Primary    Weight managament difficulties in the setting of sedentary lifestyle and poor dietary habits. Her irregular eat habits are causing a cycle of starvation mode and fat deposition in an effort to preserve life. She has a recent diagnosis of pre-diabetes, but her insurance will not cover medications that could help blood sugars  and aid in weight loss. I strongly recommend that she start making healthy habits a part of her regular routine with normal eating cycles with small portions of high protein, high fiber, low fat, and low carb options. Also recommend daily physical activity of at least 20 minutes.  Plan: - I would like you to make a plan for your goals and write these down. Today we talked about the first goal of walking 20 minutes every day or dancing 20 minutes every day. - I would like you to focus on how often you eat and eat at least 3 times a day with small portions. You can add small snacks in between.  - We will follow-up in 3 months and make additional plans      Relevant  Orders   CBC with Differential/Platelet (Completed)   Comprehensive metabolic panel (Completed)   Hemoglobin A1c (Completed)   TSH (Completed)   Elevated blood pressure reading without diagnosis of hypertension   Relevant Orders   CBC with Differential/Platelet (Completed)   Comprehensive metabolic panel (Completed)   Hemoglobin A1c (Completed)   TSH (Completed)    No follow-ups on file.  Time: 34 minutes, >50% spent counseling, care coordination, chart review, and documentation.   [name], DNP, AGNP-c 11/14/2022  3:50 PM

## 2022-11-15 LAB — COMPREHENSIVE METABOLIC PANEL
ALT: 17 IU/L (ref 0–32)
AST: 16 IU/L (ref 0–40)
Albumin/Globulin Ratio: 1.2 (ref 1.2–2.2)
Albumin: 4.3 g/dL (ref 4.0–5.0)
Alkaline Phosphatase: 86 IU/L (ref 42–106)
BUN/Creatinine Ratio: 14 (ref 9–23)
BUN: 9 mg/dL (ref 6–20)
Bilirubin Total: <0.2 mg/dL (ref 0.0–1.2)
CO2: 23 mmol/L (ref 20–29)
Calcium: 9.8 mg/dL (ref 8.7–10.2)
Chloride: 102 mmol/L (ref 96–106)
Creatinine, Ser: 0.64 mg/dL (ref 0.57–1.00)
Globulin, Total: 3.6 g/dL (ref 1.5–4.5)
Glucose: 77 mg/dL (ref 70–99)
Potassium: 4 mmol/L (ref 3.5–5.2)
Sodium: 137 mmol/L (ref 134–144)
Total Protein: 7.9 g/dL (ref 6.0–8.5)
eGFR: 130 mL/min/{1.73_m2} (ref >59)

## 2022-11-15 LAB — CBC WITH DIFFERENTIAL/PLATELET
Basophils Absolute: 0 10*3/uL (ref 0.0–0.2)
Basos: 0 %
EOS (ABSOLUTE): 0 10*3/uL (ref 0.0–0.4)
Eos: 1 %
Hematocrit: 38.6 % (ref 34.0–46.6)
Hemoglobin: 12.9 g/dL (ref 11.1–15.9)
Immature Grans (Abs): 0 10*3/uL (ref 0.0–0.1)
Immature Granulocytes: 0 %
Lymphocytes Absolute: 2.2 10*3/uL (ref 0.7–3.1)
Lymphs: 36 %
MCH: 28.4 pg (ref 26.6–33.0)
MCHC: 33.4 g/dL (ref 31.5–35.7)
MCV: 85 fL (ref 79–97)
Monocytes Absolute: 0.5 10*3/uL (ref 0.1–0.9)
Monocytes: 8 %
Neutrophils Absolute: 3.2 10*3/uL (ref 1.4–7.0)
Neutrophils: 55 %
Platelets: 384 10*3/uL (ref 150–450)
RBC: 4.54 x10E6/uL (ref 3.77–5.28)
RDW: 12.5 % (ref 11.7–15.4)
WBC: 6 10*3/uL (ref 3.4–10.8)

## 2022-11-15 LAB — HEMOGLOBIN A1C
Est. average glucose Bld gHb Est-mCnc: 120 mg/dL
Hgb A1c MFr Bld: 5.8 % — ABNORMAL HIGH (ref 4.8–5.6)

## 2022-11-15 LAB — TSH: TSH: 2.88 u[IU]/mL (ref 0.450–4.500)

## 2022-11-20 ENCOUNTER — Encounter (INDEPENDENT_AMBULATORY_CARE_PROVIDER_SITE_OTHER): Payer: Medicaid Other | Admitting: Family Medicine

## 2022-11-21 ENCOUNTER — Telehealth (INDEPENDENT_AMBULATORY_CARE_PROVIDER_SITE_OTHER): Payer: Medicaid Other | Admitting: Nurse Practitioner

## 2022-11-21 ENCOUNTER — Encounter: Payer: Self-pay | Admitting: Nurse Practitioner

## 2022-11-21 VITALS — Wt 324.0 lb

## 2022-11-21 DIAGNOSIS — J3089 Other allergic rhinitis: Secondary | ICD-10-CM

## 2022-11-21 DIAGNOSIS — J302 Other seasonal allergic rhinitis: Secondary | ICD-10-CM

## 2022-11-21 DIAGNOSIS — T7840XA Allergy, unspecified, initial encounter: Secondary | ICD-10-CM

## 2022-11-21 DIAGNOSIS — J0100 Acute maxillary sinusitis, unspecified: Secondary | ICD-10-CM | POA: Insufficient documentation

## 2022-11-21 HISTORY — DX: Acute maxillary sinusitis, unspecified: J01.00

## 2022-11-21 MED ORDER — LEVOCETIRIZINE DIHYDROCHLORIDE 5 MG PO TABS: 5.0000 mg | ORAL_TABLET | Freq: Every evening | ORAL | 11 refills | Status: AC

## 2022-11-21 MED ORDER — AZITHROMYCIN 250 MG PO TABS: ORAL_TABLET | ORAL | 0 refills | Status: AC

## 2022-11-21 NOTE — Progress Notes (Signed)
Virtual Visit Encounter telephone visit.   I connected with  [name] on 11/21/22 at  1:45 PM EDT by secure audio telemedicine application. I verified that I am speaking with the correct person using two identifiers.   I introduced myself as a Publishing rights manager with the practice. The limitations of evaluation and management by telemedicine discussed with the patient and the availability of in person appointments. The patient expressed verbal understanding and consent to proceed.  Participating parties in this visit include: Myself and patient  The patient is: Patient Location: Home I am: Provider Location: Office/Clinic Subjective:    CC and HPI: Sherry Dawson is a 22 y.o. year old female presenting for new evaluation and treatment of scratchy throat and congestion.   Kaleen Odea tells me that she was outside during the eclipse earlier this week and was leaning on the car that was covered in pollen. She noticed that evening that she developed a sore throat, sneezing, and mild congestion. She did take a xyzal and that has helped with the sore throat, but she is still dealing with considerable congestion and runny nose. She also feels as though she is running a fever. She has also been using nasal spray, but has not noticed a difference in symptoms.   Past medical history, Surgical history, Family history not pertinant except as noted below, Social history, Allergies, and medications have been entered into the medical record, reviewed, and corrections made.   Review of Systems:  All review of systems negative except what is listed in the HPI  Objective:    Alert and oriented x 4 Audibly congested Speaking in clear sentences with no shortness of breath. No distress.  Impression and Recommendations:    Problem List Items Addressed This Visit     Perennial allergic rhinitis with seasonal variation    Seasonal allergy exacerbation present. Recommend xyzal nightly at bedtime and nasal  spray at least once a day to help reduce the inflammation and congestion. Continue the medication through June, but if symptoms return, restart the medication through November.       Allergies - Primary   Relevant Medications   azithromycin (ZITHROMAX) 250 MG tablet   Acute non-recurrent maxillary sinusitis    Symptoms consistent with sinusitis secondary to allergies. Given that she is experiencing chills and subjective fever, I feel it is reasonable to start with azithromycin to ensure that this does not progress further.  Plan: - start xyzal at bedtime daily at least through June - start azithromycin  - continue to use the nasal spray - stay hydrated      Relevant Medications   azithromycin (ZITHROMAX) 250 MG tablet   levocetirizine (XYZAL) 5 MG tablet    orders and follow up as documented in EMR I discussed the assessment and treatment plan with the patient. The patient was provided an opportunity to ask questions and all were answered. The patient agreed with the plan and demonstrated an understanding of the instructions.   The patient was advised to call back or seek an in-person evaluation if the symptoms worsen or if the condition fails to improve as anticipated.  Follow-Up: prn  I provided 10 minutes of non-face-to-face interaction with this non face-to-face encounter including intake, same-day documentation, and chart review.   Tollie Eth, NP , DNP, AGNP-c [facility]

## 2022-11-21 NOTE — Assessment & Plan Note (Signed)
Seasonal allergy exacerbation present. Recommend xyzal nightly at bedtime and nasal spray at least once a day to help reduce the inflammation and congestion. Continue the medication through June, but if symptoms return, restart the medication through November.

## 2022-11-21 NOTE — Assessment & Plan Note (Signed)
Symptoms consistent with sinusitis secondary to allergies. Given that she is experiencing chills and subjective fever, I feel it is reasonable to start with azithromycin to ensure that this does not progress further.  Plan: - start xyzal at bedtime daily at least through June - start azithromycin  - continue to use the nasal spray - stay hydrated

## 2022-11-22 NOTE — Assessment & Plan Note (Signed)
Weight managament difficulties in the setting of sedentary lifestyle and poor dietary habits. Her irregular eat habits are causing a cycle of starvation mode and fat deposition in an effort to preserve life. She has a recent diagnosis of pre-diabetes, but her insurance will not cover medications that could help blood sugars and aid in weight loss. I strongly recommend that she start making healthy habits a part of her regular routine with normal eating cycles with small portions of high protein, high fiber, low fat, and low carb options. Also recommend daily physical activity of at least 20 minutes.  Plan: - I would like you to make a plan for your goals and write these down. Today we talked about the first goal of walking 20 minutes every day or dancing 20 minutes every day. - I would like you to focus on how often you eat and eat at least 3 times a day with small portions. You can add small snacks in between.  - We will follow-up in 3 months and make additional plans

## 2022-12-06 ENCOUNTER — Other Ambulatory Visit: Payer: Self-pay | Admitting: Nurse Practitioner

## 2022-12-06 DIAGNOSIS — Z6841 Body Mass Index (BMI) 40.0 and over, adult: Secondary | ICD-10-CM

## 2023-01-09 ENCOUNTER — Encounter (INDEPENDENT_AMBULATORY_CARE_PROVIDER_SITE_OTHER): Payer: Medicaid Other | Admitting: Internal Medicine

## 2023-01-14 ENCOUNTER — Telehealth: Payer: Self-pay

## 2023-01-14 NOTE — Telephone Encounter (Signed)
LVM for patient to call back [phone], or to call PCP office to schedule follow up apt. AS, CMA  

## 2023-01-15 ENCOUNTER — Encounter (INDEPENDENT_AMBULATORY_CARE_PROVIDER_SITE_OTHER): Payer: Medicaid Other | Admitting: Family Medicine

## 2023-02-11 ENCOUNTER — Encounter: Payer: Self-pay | Admitting: Nurse Practitioner

## 2023-02-11 ENCOUNTER — Ambulatory Visit (INDEPENDENT_AMBULATORY_CARE_PROVIDER_SITE_OTHER): Payer: MEDICAID | Admitting: Nurse Practitioner

## 2023-02-11 VITALS — BP 128/78 | HR 77 | Temp 98.7°F | Resp 16 | Wt 326.0 lb

## 2023-02-11 DIAGNOSIS — F4321 Adjustment disorder with depressed mood: Secondary | ICD-10-CM | POA: Diagnosis not present

## 2023-02-11 NOTE — Progress Notes (Signed)
  Tollie Eth, DNP, AGNP-c [facility] [address] [place], [place] [address] [phone]   ACUTE VISIT- ESTABLISHED PATIENT  Blood pressure 128/78, pulse 77, temperature 98.7 F (37.1 C), temperature source Oral, resp. rate 16, weight (!) 326 lb (147.9 kg), SpO2 97%.  Subjective:  HPI Sherry Dawson is a 22 y.o. female presents to day for evaluation of: grief reaction.   Sherry Dawson tells me she recently lost her father suddenly and this has been emotionally very difficult for her. She reports she was unprepared for this and it has been exceptionally hard to cope with the loss and she has been tearful and sad.    PMH, Medications, and Allergies reviewed and updated in chart as appropriate.   ROS negative except for what is listed in HPI. Objective:  Physical Exam Cardiovascular:     Rate and Rhythm: Normal rate and regular rhythm.     Pulses: Normal pulses.     Heart sounds: Normal heart sounds.  Pulmonary:     Effort: Pulmonary effort is normal.     Breath sounds: Normal breath sounds.  Neurological:     Mental Status: She is oriented to person, place, and time.  Psychiatric:        Attention and Perception: Attention normal.        Mood and Affect: Mood is depressed. Affect is tearful.        Speech: Speech normal.        Behavior: Behavior normal. Behavior is cooperative.        Thought Content: Thought content normal.        Judgment: Judgment normal.         Assessment & Plan:   Problem List Items Addressed This Visit     Grief reaction - Primary    Recent loss of her father unexpectedly with normal grief reaction present. We discussed the process of grief and the stages that may accompany. We also discussed that each person responds uniquely to this and there is not a projected time frame. We discussed medication options, but she is not interested in this at this time. I recommend that she reach out to Hospice to discuss meeting with a loss/grief  counselor to discuss her feelings and work through her loss. She feels this would be helpful. No alarm symptoms are present at this time.          Time: 35 minutes, >50% spent counseling, care coordination, chart review, and documentation.    Tollie Eth, DNP, AGNP-c   History, Medications, Surgery, SDOH, and Family History reviewed and updated as appropriate.

## 2023-03-07 ENCOUNTER — Ambulatory Visit: Payer: MEDICAID | Admitting: Nurse Practitioner

## 2023-03-21 ENCOUNTER — Other Ambulatory Visit (HOSPITAL_COMMUNITY): Payer: Self-pay

## 2023-03-21 ENCOUNTER — Other Ambulatory Visit: Payer: Self-pay | Admitting: Nurse Practitioner

## 2023-03-21 DIAGNOSIS — J4540 Moderate persistent asthma, uncomplicated: Secondary | ICD-10-CM

## 2023-03-21 DIAGNOSIS — F329 Major depressive disorder, single episode, unspecified: Secondary | ICD-10-CM

## 2023-03-21 MED ORDER — CYCLOSPORINE 0.05 % OP EMUL
1.0000 [drp] | Freq: Two times a day (BID) | OPHTHALMIC | 12 refills | Status: AC
  Filled 2023-03-21: qty 180, 90d supply, fill #0
  Filled 2023-04-04: qty 60, 30d supply, fill #0
  Filled 2023-04-22: qty 180, 90d supply, fill #0

## 2023-03-21 MED ORDER — BUDESONIDE-FORMOTEROL FUMARATE 160-4.5 MCG/ACT IN AERO: 2.0000 | INHALATION_SPRAY | Freq: Two times a day (BID) | RESPIRATORY_TRACT | 1 refills | Status: AC

## 2023-03-21 MED ORDER — SERTRALINE HCL 50 MG PO TABS: 50.0000 mg | ORAL_TABLET | Freq: Every day | ORAL | 3 refills | Status: DC

## 2023-03-21 MED ORDER — IPRATROPIUM BROMIDE 0.06 % NA SOLN: 2.0000 | Freq: Three times a day (TID) | NASAL | 11 refills | Status: AC

## 2023-03-21 MED ORDER — FLUTICASONE PROPIONATE 50 MCG/ACT NA SUSP: 2.0000 | Freq: Every day | NASAL | 6 refills | Status: AC

## 2023-03-21 MED ORDER — ALBUTEROL SULFATE HFA 108 (90 BASE) MCG/ACT IN AERS: 2.0000 | INHALATION_SPRAY | Freq: Four times a day (QID) | RESPIRATORY_TRACT | 5 refills | Status: DC | PRN

## 2023-03-21 MED ORDER — TRAZODONE HCL 50 MG PO TABS: 25.0000 mg | ORAL_TABLET | Freq: Every evening | ORAL | 3 refills | Status: DC | PRN

## 2023-03-24 ENCOUNTER — Telehealth: Payer: Self-pay | Admitting: Nurse Practitioner

## 2023-03-24 DIAGNOSIS — F4321 Adjustment disorder with depressed mood: Secondary | ICD-10-CM | POA: Insufficient documentation

## 2023-03-24 DIAGNOSIS — F4320 Adjustment disorder, unspecified: Secondary | ICD-10-CM | POA: Insufficient documentation

## 2023-03-24 NOTE — Telephone Encounter (Signed)
Recv'd P.A. Albuterol, Called pharmacy & authorized brand, or which ever is preferred to go thru pt's ins.  They were able to get it to go thru.

## 2023-03-24 NOTE — Assessment & Plan Note (Signed)
Recent loss of her father unexpectedly with normal grief reaction present. We discussed the process of grief and the stages that may accompany. We also discussed that each person responds uniquely to this and there is not a projected time frame. We discussed medication options, but she is not interested in this at this time. I recommend that she reach out to Hospice to discuss meeting with a loss/grief counselor to discuss her feelings and work through her loss. She feels this would be helpful. No alarm symptoms are present at this time.

## 2023-04-04 ENCOUNTER — Other Ambulatory Visit (HOSPITAL_COMMUNITY): Payer: Self-pay

## 2023-04-16 ENCOUNTER — Other Ambulatory Visit (HOSPITAL_COMMUNITY): Payer: Self-pay

## 2023-04-21 ENCOUNTER — Encounter: Payer: Self-pay | Admitting: Nurse Practitioner

## 2023-04-21 ENCOUNTER — Telehealth (INDEPENDENT_AMBULATORY_CARE_PROVIDER_SITE_OTHER): Payer: MEDICAID | Admitting: Nurse Practitioner

## 2023-04-21 ENCOUNTER — Other Ambulatory Visit (HOSPITAL_COMMUNITY): Payer: Self-pay

## 2023-04-21 DIAGNOSIS — F329 Major depressive disorder, single episode, unspecified: Secondary | ICD-10-CM

## 2023-04-21 DIAGNOSIS — F4321 Adjustment disorder with depressed mood: Secondary | ICD-10-CM

## 2023-04-21 MED ORDER — TRAZODONE HCL 50 MG PO TABS
25.0000 mg | ORAL_TABLET | Freq: Every evening | ORAL | 3 refills | Status: DC | PRN
  Filled 2023-04-21: qty 90, 90d supply, fill #0

## 2023-04-21 MED ORDER — SERTRALINE HCL 50 MG PO TABS
50.0000 mg | ORAL_TABLET | Freq: Every day | ORAL | 3 refills | Status: DC
  Filled 2023-04-21: qty 30, 30d supply, fill #0

## 2023-04-21 NOTE — Patient Instructions (Signed)
Jayd,   I really do think that starting your medication will be helpful, specifically during this time of changes and working through your recent loss. I have resent the medication to get you started.   SaraBeth

## 2023-04-21 NOTE — Assessment & Plan Note (Signed)
She is still dealing with her grief over the loss of her father. I suspect that this is exacerbated with the inability to mourn and the stressors around her pending the upcoming move. I do feel that the move will be beneficial for a fresh start, but I am concerned that even this good stressor will be challenging if we are not able to get her feeling better before she leaves. I have encouraged her to start the medication at least for a short period of time.

## 2023-04-21 NOTE — Progress Notes (Signed)
Virtual Visit Encounter mychart visit.   I connected with  Sherry Dawson on 04/21/23 at  3:45 PM EDT by secure video and audio telemedicine application. I verified that I am speaking with the correct person using two identifiers.   I introduced myself as a Publishing rights manager with the practice. The limitations of evaluation and management by telemedicine discussed with the patient and the availability of in person appointments. The patient expressed verbal understanding and consent to proceed.  Participating parties in this visit include: Myself and patient  The patient is: Patient Location: Home I am: Provider Location: Office/Clinic Subjective:    CC and HPI: Sherry Dawson is a 22 y.o. year old female presenting for follow up of mood . Patient reports the following:  Sherry Dawson tells me she has not started her medication. She does not think there is an issue with her insurance right now. She is not sure why she has not started this. She reports her mood is still poor and she is not feeling any better. She tells me that she is still mourning the loss of her father and she is ready to make a fresh start in a new state. She is excited about the upcoming move with her mom to [place]. She reports that while they were there last time she slept well every night, but since being back she has struggled to rest.   Past medical history, Surgical history, Family history not pertinant except as noted below, Social history, Allergies, and medications have been entered into the medical record, reviewed, and corrections made.   Review of Systems:  All review of systems negative except what is listed in the HPI  Objective:    Alert and oriented x 4 Speaking in clear sentences with no shortness of breath. No distress.  Impression and Recommendations:    Problem List Items Addressed This Visit     Grief reaction - Primary    She is still dealing with her grief over the loss of her father. I suspect  that this is exacerbated with the inability to mourn and the stressors around her pending the upcoming move. I do feel that the move will be beneficial for a fresh start, but I am concerned that even this good stressor will be challenging if we are not able to get her feeling better before she leaves. I have encouraged her to start the medication at least for a short period of time.       Other Visit Diagnoses     Reactive depression       Relevant Medications   traZODone (DESYREL) 50 MG tablet   sertraline (ZOLOFT) 50 MG tablet       orders and follow up as documented in EMR I discussed the assessment and treatment plan with the patient. The patient was provided an opportunity to ask questions and all were answered. The patient agreed with the plan and demonstrated an understanding of the instructions.   The patient was advised to call back or seek an in-person evaluation if the symptoms worsen or if the condition fails to improve as anticipated.  Follow-Up: 3-4 weeks  I provided 30  minutes of non-face-to-face interaction with this non face-to-face encounter including intake, same-day documentation, and chart review.   Tollie Eth, NP , DNP, AGNP-c [facility]

## 2023-04-22 ENCOUNTER — Other Ambulatory Visit (HOSPITAL_COMMUNITY): Payer: Self-pay

## 2023-05-01 ENCOUNTER — Other Ambulatory Visit (HOSPITAL_COMMUNITY): Payer: Self-pay

## 2023-05-01 ENCOUNTER — Other Ambulatory Visit: Payer: Self-pay | Admitting: Nurse Practitioner

## 2023-05-01 DIAGNOSIS — J4540 Moderate persistent asthma, uncomplicated: Secondary | ICD-10-CM

## 2023-05-01 MED ORDER — ALBUTEROL SULFATE HFA 108 (90 BASE) MCG/ACT IN AERS
2.0000 | INHALATION_SPRAY | Freq: Four times a day (QID) | RESPIRATORY_TRACT | 5 refills | Status: AC | PRN
  Filled 2023-05-01: qty 18, 25d supply, fill #0

## 2023-05-12 ENCOUNTER — Other Ambulatory Visit (HOSPITAL_COMMUNITY): Payer: Self-pay

## 2023-05-14 ENCOUNTER — Other Ambulatory Visit (HOSPITAL_COMMUNITY): Payer: Self-pay

## 2023-05-27 ENCOUNTER — Encounter: Payer: Self-pay | Admitting: Nurse Practitioner

## 2023-05-27 ENCOUNTER — Telehealth (INDEPENDENT_AMBULATORY_CARE_PROVIDER_SITE_OTHER): Payer: MEDICAID | Admitting: Nurse Practitioner

## 2023-05-27 DIAGNOSIS — F411 Generalized anxiety disorder: Secondary | ICD-10-CM

## 2023-05-27 DIAGNOSIS — F4321 Adjustment disorder with depressed mood: Secondary | ICD-10-CM | POA: Diagnosis not present

## 2023-05-27 NOTE — Progress Notes (Signed)
Virtual Visit Encounter mychart visit.   I connected with  [name] on 05/27/23 at 11:15 AM EDT by secure video and audio telemedicine application. I verified that I am speaking with the correct person using two identifiers.   I introduced myself as a Publishing rights manager with the practice. The limitations of evaluation and management by telemedicine discussed with the patient and the availability of in person appointments. The patient expressed verbal understanding and consent to proceed.  Participating parties in this visit include: Myself and patient  The patient is: Patient Location: Home I am: Provider Location: Office/Clinic Subjective:    CC and HPI: Armonie Mettler is a 22 y.o. year old female presenting for follow up of mood. History of Present Illness [place] recently relocated and is adjusting to a new environment. She reports a significant improvement in sleep since the move. She had been prescribed sertraline but discontinued it due to an intensification of feelings of grief related to the loss of her father after starting. The patient describes occasional vivid flashbacks of her father's death, which cause temporary distress but do not persistently affect her mood. She has been managing these episodes by discussing them with her brother, who shares similar experiences.  The patient also reports frequent episodes of allergies, which have been somewhat exacerbated since the move. She has been managing these symptoms with daily doses of Xyzal, Clonidine, and Singulair. Despite the medication, the patient notes that the allergies persist, possibly due to the change in environment and climate.  The patient is currently seeking employment and is actively applying for jobs. She reports that she is managing without the need for antidepressant medication at this time. She also expresses a desire to avoid medications that might negatively affect her mental state.  Past medical history,  Surgical history, Family history not pertinant except as noted below, Social history, Allergies, and medications have been entered into the medical record, reviewed, and corrections made.   Review of Systems:  All review of systems negative except what is listed in the HPI  Objective:    Alert and oriented x 4 Speaking in clear sentences with no shortness of breath. No distress.  Impression and Recommendations:    Problem List Items Addressed This Visit   None   current treatment plan is effective, no change in therapy I discussed the assessment and treatment plan with the patient. The patient was provided an opportunity to ask questions and all were answered. The patient agreed with the plan and demonstrated an understanding of the instructions.   The patient was advised to call back or seek an in-person evaluation if the symptoms worsen or if the condition fails to improve as anticipated.  Follow-Up: prn  I provided 28 minutes of non-face-to-face interaction with this non face-to-face encounter including intake, same-day documentation, and chart review.   Tollie Eth, NP , DNP, AGNP-c [facility]

## 2023-05-27 NOTE — Patient Instructions (Signed)
We will continue to monitor your mood and grief process, and there are no changes to your medication at this time. Continue to remain off of the sertraline. Please let me know if you feel your mood is changing.

## 2023-05-27 NOTE — Assessment & Plan Note (Signed)
Patient reported discontinuing sertraline due to adverse effects, including worsening mood. Patient is currently managing without medication and reports improvement in mood since moving to a new location. However, patient continues to experience grief-related flashbacks of her father's death, which are currently self-limited and do not affect her overall functioning. -Continue to monitor mood and grief process. No medication changes at this time.

## 2023-05-27 NOTE — Assessment & Plan Note (Signed)
She reports improvement of her mood since her move. She has stopped taking the sertraline due to unwanted side effects. At this time she does not feel that medication is needed. We will plan to continue to monitor and she will let me know if her mood changes. No alarm symptoms are present at this time.
